# Patient Record
Sex: Male | Born: 1985 | Race: Black or African American | Hispanic: No | Smoking: Never smoker
Health system: Southern US, Community
[De-identification: ages and names within clinical notes are randomized; demographics above are authoritative.]

---

## 2015-02-16 ENCOUNTER — Emergency Department
Admission: EM | Admit: 2015-02-16 | Discharge: 2015-02-16 | Disposition: A | Discharge status: Home / Self Care | Payer: Self-pay | Attending: Student | Admitting: Student

## 2015-02-16 ENCOUNTER — Emergency Department: Payer: Self-pay

## 2015-02-16 DIAGNOSIS — Y9289 Other specified places as the place of occurrence of the external cause: Secondary | ICD-10-CM | POA: Insufficient documentation

## 2015-02-16 DIAGNOSIS — S63501A Unspecified sprain of right wrist, initial encounter: Secondary | ICD-10-CM | POA: Insufficient documentation

## 2015-02-16 DIAGNOSIS — Y998 Other external cause status: Secondary | ICD-10-CM | POA: Insufficient documentation

## 2015-02-16 DIAGNOSIS — W1839XA Other fall on same level, initial encounter: Secondary | ICD-10-CM | POA: Insufficient documentation

## 2015-02-16 DIAGNOSIS — S63061A Subluxation of metacarpal (bone), proximal end of right hand, initial encounter: Secondary | ICD-10-CM | POA: Insufficient documentation

## 2015-02-16 DIAGNOSIS — Y9389 Activity, other specified: Secondary | ICD-10-CM | POA: Insufficient documentation

## 2015-02-16 MED ORDER — TRAMADOL HCL 50 MG PO TABS: 50.0000 mg | ORAL_TABLET | Freq: Four times a day (QID) | ORAL | Status: AC | PRN

## 2015-02-16 NOTE — ED Notes (Signed)
Reports fishing 2 wks ago and slipped and fell and hurt right hand.  Still having pain. +PMS

## 2015-02-16 NOTE — Discharge Instructions (Signed)
Cryotherapy Cryotherapy is when you put ice on your injury. Ice helps lessen pain and puffiness (swelling) after an injury. Ice works the best when you start using it in the first 24 to 48 hours after an injury. HOME CARE  Put a dry or damp towel between the ice pack and your skin.  You may press gently on the ice pack.  Leave the ice on for no more than 10 to 20 minutes at a time.  Check your skin after 5 minutes to make sure your skin is okay.  Rest at least 20 minutes between ice pack uses.  Stop using ice when your skin loses feeling (numbness).  Do not use ice on someone who cannot tell you when it hurts. This includes small children and people with memory problems (dementia). GET HELP RIGHT AWAY IF:  You have white spots on your skin.  Your skin turns blue or pale.  Your skin feels waxy or hard.  Your puffiness gets worse. MAKE SURE YOU:   Understand these instructions.  Will watch your condition.  Will get help right away if you are not doing well or get worse. Document Released: 01/21/2008 Document Revised: 10/27/2011 Document Reviewed: 03/27/2011 [facility] Patient Information 2015 [place]. This information is not intended to replace advice given to you by your health care provider. Make sure you discuss any questions you have with your health care provider.  Dislocation or Subluxation Dislocation of a joint occurs when ends of two or more adjacent bones no longer touch each other. A subluxation is a minor form of a dislocation, in which two or more adjacent bones are no longer properly aligned. The most common joints susceptible to a dislocation are the shoulder, kneecap, and fingers.  SYMPTOMS   Sudden pain at the time of injury.  Noticeable deformity in the area of the joint.  Limited range of motion. CAUSES   Usually a traumatic injury that stretches or tears ligaments that surround a joint and hold the bones together.  Condition present at birth  (congenital) in which the joint surfaces are shallow or abnormally formed.  Joint disease such as arthritis or other diseases of ligaments and tissues around a joint. RISK INCREASES WITH:  Repeated injury to a joint.  Previous dislocation of a joint.  Contact sports (football, rugby, hockey, lacrosse) or sports that require repetitive overhead arm motion (throwing, swimming, volleyball).  Rheumatoid arthritis.  Congenital joint condition. PREVENTION  Warm up and stretch properly before activity.  Maintain physical fitness:  Joint flexibility.  Muscle strength and endurance.  Cardiovascular fitness.  Wear proper protective equipment and ensure correct fit.  Learn and use proper technique. PROGNOSIS  This condition is usually curable with prompt treatment. After the dislocation has been put back in place, the joint may require immobilization with a cast, splint, or sling for 2 to 6 weeks, often followed by strength and stretching exercises that may be performed at home or with a therapist. RELATED COMPLICATIONS   Damage to nearby nerves or major blood vessels, causing numbness, coldness, or paleness.  Recurrent injury to the joint.  Arthritis of affected joint.  Fracture of joint. TREATMENT Treatment initially involves realigning the bones (reduction) of the joint. Reductions should only be performed by someone who is trained in the procedure. After the joint is reduced, medicine and ice should be used to reduce pain and inflammation. The joint may be immobilized to allow for the muscles and ligaments to heal. If a joint is subjected  to recurrent dislocations, surgery may be necessary to tighten or replace injured structures. After surgery, stretching and strengthening exercises may be required. These may be performed at home or with a therapist. MEDICATION   Patients may require medicine to help them relax (sedative) or muscle relaxants in order to reduce the joint.  If  pain medicine is necessary, nonsteroidal anti-inflammatory medicines, such as aspirin and ibuprofen, or other minor pain relievers, such as acetaminophen, are often recommended.  Do not take pain medicine for 7 days before surgery.  Prescription pain relievers may be necessary. Use only as directed and only as much as you need. SEEK MEDICAL CARE IF:   Symptoms get worse or do not improve despite treatment.  You have difficulty moving a joint after injury.  Any extremity becomes numb, pale, or cool after injury. This is an emergency.  Dislocations or subluxations occur repeatedly. Document Released: 08/04/2005 Document Revised: 10/27/2011 Document Reviewed: 11/16/2008 [facility] Patient Information 2015 [place], [place]. This information is not intended to replace advice given to you by your health care provider. Make sure you discuss any questions you have with your health care provider.

## 2015-02-16 NOTE — ED Provider Notes (Signed)
CSN: [id number]     Arrival date & time 02/16/15  1649 History   First MD Initiated Contact with Patient 02/16/15 1726     Chief Complaint  Patient presents with  . Hand Pain     (Consider location/radiation/quality/duration/timing/severity/associated sxs/prior Treatment)  HPI 29 year old male presents today for evaluation of right hand pain. One day ago patient fell and landed on his right hand and wrist. Patient developed pain along the dorsal aspect of the right hand at the base of the third metacarpal. He's had mild swelling. Pain is been moderate. She's been taking ibuprofen and Tylenol with minimal relief. No other injuries to his body. He is able to make a fist. He is right-hand dominant    History reviewed. No pertinent past medical history. History reviewed. No pertinent past surgical history. No family history on file. History  Substance Use Topics  . Smoking status: Never Smoker   . Smokeless tobacco: Never Used  . Alcohol Use: No    Review of Systems  Constitutional: Negative.  Negative for fever, chills, activity change and appetite change.  HENT: Negative for congestion, ear pain, mouth sores, rhinorrhea, sinus pressure, sore throat and trouble swallowing.   Eyes: Negative for photophobia, pain and discharge.  Respiratory: Negative for cough, chest tightness and shortness of breath.   Cardiovascular: Negative for chest pain and leg swelling.  Gastrointestinal: Negative for nausea, vomiting, abdominal pain, diarrhea and abdominal distention.  Genitourinary: Negative for dysuria and difficulty urinating.  Musculoskeletal: Positive for joint swelling and arthralgias. Negative for back pain and gait problem.  Skin: Negative for color change and rash.  Neurological: Negative for dizziness and headaches.  Hematological: Negative for adenopathy.  Psychiatric/Behavioral: Negative for behavioral problems and agitation.      Allergies  Review of patient's allergies  indicates no known allergies.  Home Medications   Prior to Admission medications   Medication Sig Start Date End Date Taking? Authorizing Provider  traMADol (ULTRAM) 50 MG tablet Take 1 tablet (50 mg total) by mouth every 6 (six) hours as needed. 02/16/15   Evon Slackhomas C Marliss Buttacavoli, PA-C   BP 133/80 mmHg  Pulse 54  Temp(Src) 98.3 F (36.8 C) (Oral)  Resp 18  Ht 6' (1.829 m)  Wt 180 lb (81.647 kg)  BMI 24.41 kg/m2  SpO2 100% Physical Exam  Constitutional: He is oriented to person, place, and time. He appears well-developed and well-nourished.  HENT:  Head: Normocephalic and atraumatic.  Eyes: Conjunctivae and EOM are normal.  Neck: Normal range of motion. Neck supple.  Pulmonary/Chest: Effort normal. No respiratory distress.  Musculoskeletal:       Right hand: He exhibits bony tenderness (third MCP joint). He exhibits normal range of motion, normal capillary refill and no deformity. Normal sensation noted. Decreased sensation is not present in the ulnar distribution, is not present in the medial distribution and is not present in the radial distribution. Normal strength noted.  No scaphoid tenderness. Full composite fist. Grip strength 5 out of 5. No distal radius or ulnar styloid tenderness. No DRUJ instability  Neurological: He is alert and oriented to person, place, and time.  Skin: Skin is warm and dry.  Psychiatric: He has a normal mood and affect. His behavior is normal. Judgment and thought content normal.    ED Course  Procedures (including critical care time) Labs Review Labs Reviewed - No data to display  Imaging Review Dg Hand Complete Right  02/16/2015   CLINICAL DATA:  Status post fall while fishing  2 weeks ago, with injury to right hand. Right second and third metacarpal pain. Initial encounter.  EXAM: RIGHT HAND - COMPLETE 3+ VIEW  COMPARISON:  None.  FINDINGS: There is misalignment of the dorsal aspect of the base of either the second or third metacarpal on the lateral view.  This is thought more likely to be the base of the third metacarpal. Associated cortical irregularity is seen. This may reflect small fractures of the capitate and metacarpal with associated dorsal displacement, or could reflect a chronic injury given a relatively well-corticated appearance. Would correlate for associated focal symptoms.  The carpal rows are otherwise intact. Soft tissue swelling is noted along the dorsum of the wrist.  IMPRESSION: Misalignment of the dorsal aspect of the base of either the second or third metacarpal, thought more likely to the base of the third metacarpal. This may reflect small fractures of the capitate and third metacarpal, with associated dorsal displacement, or could reflect a chronic injury given a relatively well-corticated appearance. Would correlate for associated focal symptoms.   Electronically Signed   By: Roanna Raider M.D.   On: 02/16/2015 17:32     EKG Interpretation None      MDM   Final diagnoses:  Wrist sprain, right, initial encounter  Subluxation of proximal end of metacarpal bone, right, initial encounter    29 year old male with 2 day history of right dorsal hand pain along the base of the third metacarpal. X-rays show minimal dorsal displacement of the 3rd metacarpal at the MCP joint. Patient with no angulation or rotation of the digits. He is able to make a fist. Recommend that he be placed into a Velcro wrist splint and follow-up with orthopedics in 2 days. The meantime rest ice and elevate the upper extremity. Continue Tylenol and ibuprofen for pain. Patient was given a prescription for tramadol 50 mg 1 tab by mouth every 4-6 hours as needed for pain quantity #20.    Evon Slack, PA-C 02/16/15 1801  Gayla Doss, MD 02/17/15 [phone]

## 2016-05-24 IMAGING — CR DG HAND COMPLETE 3+V*R*
1 series · 3 of 3 positions shown · non-contrast
Comparison: None.

CLINICAL DATA: Status post fall while fishing 2 weeks ago, with
injury to right hand. Right second and third metacarpal pain.
Initial encounter.

EXAM:
RIGHT HAND - COMPLETE 3+ VIEW

[Series 1: pa · 0.17mm/px · 3 of 3 slices shown]
[im 1/3]
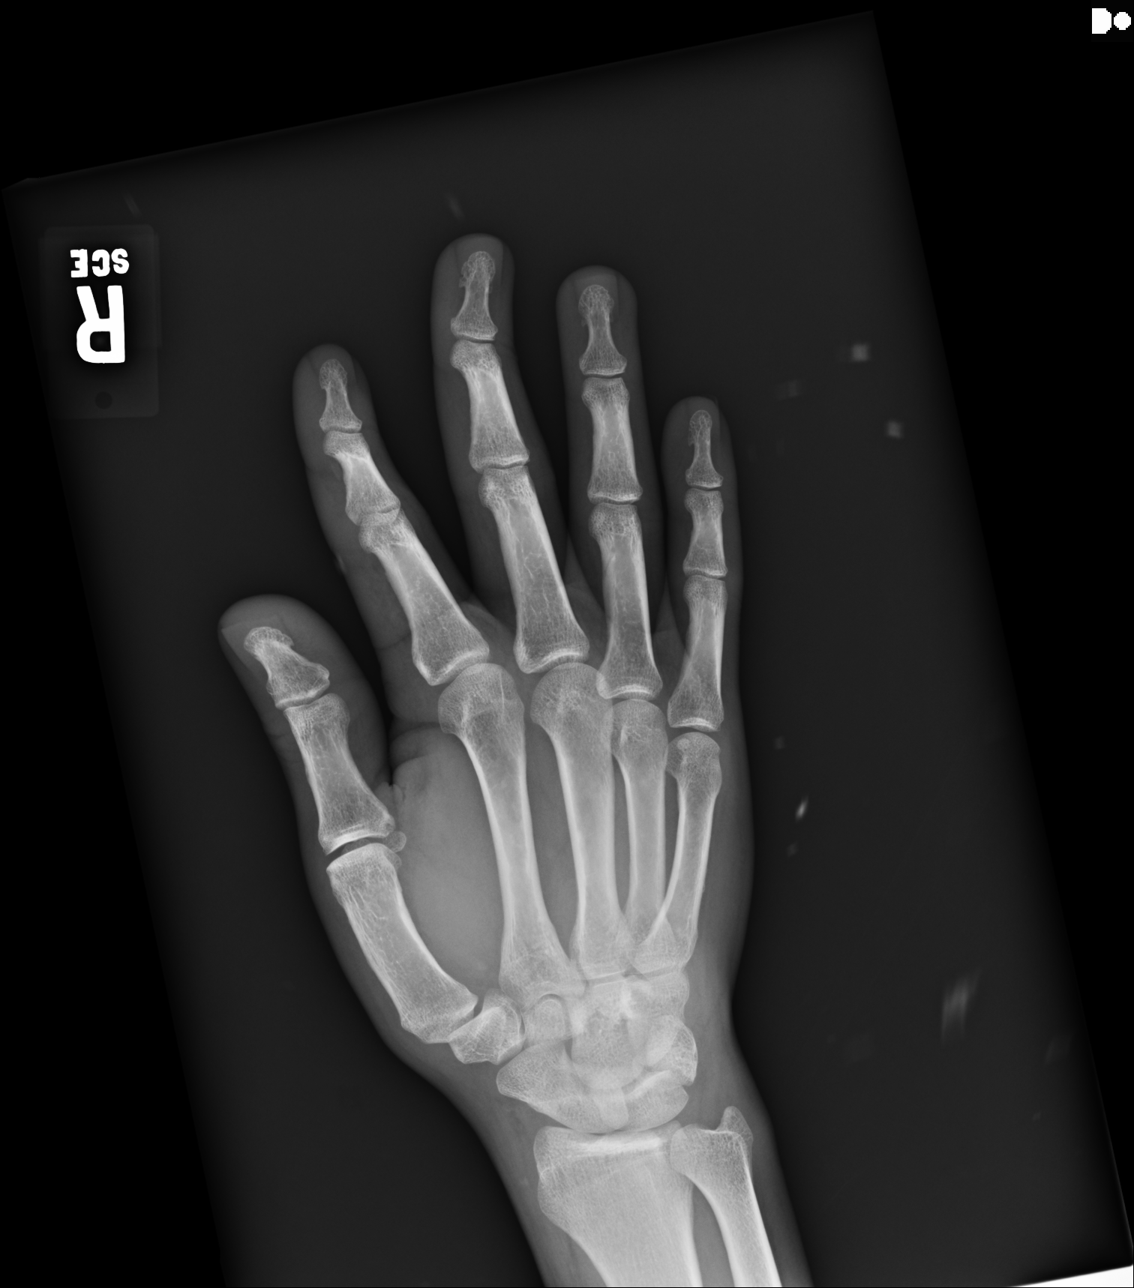
[im 2/3]
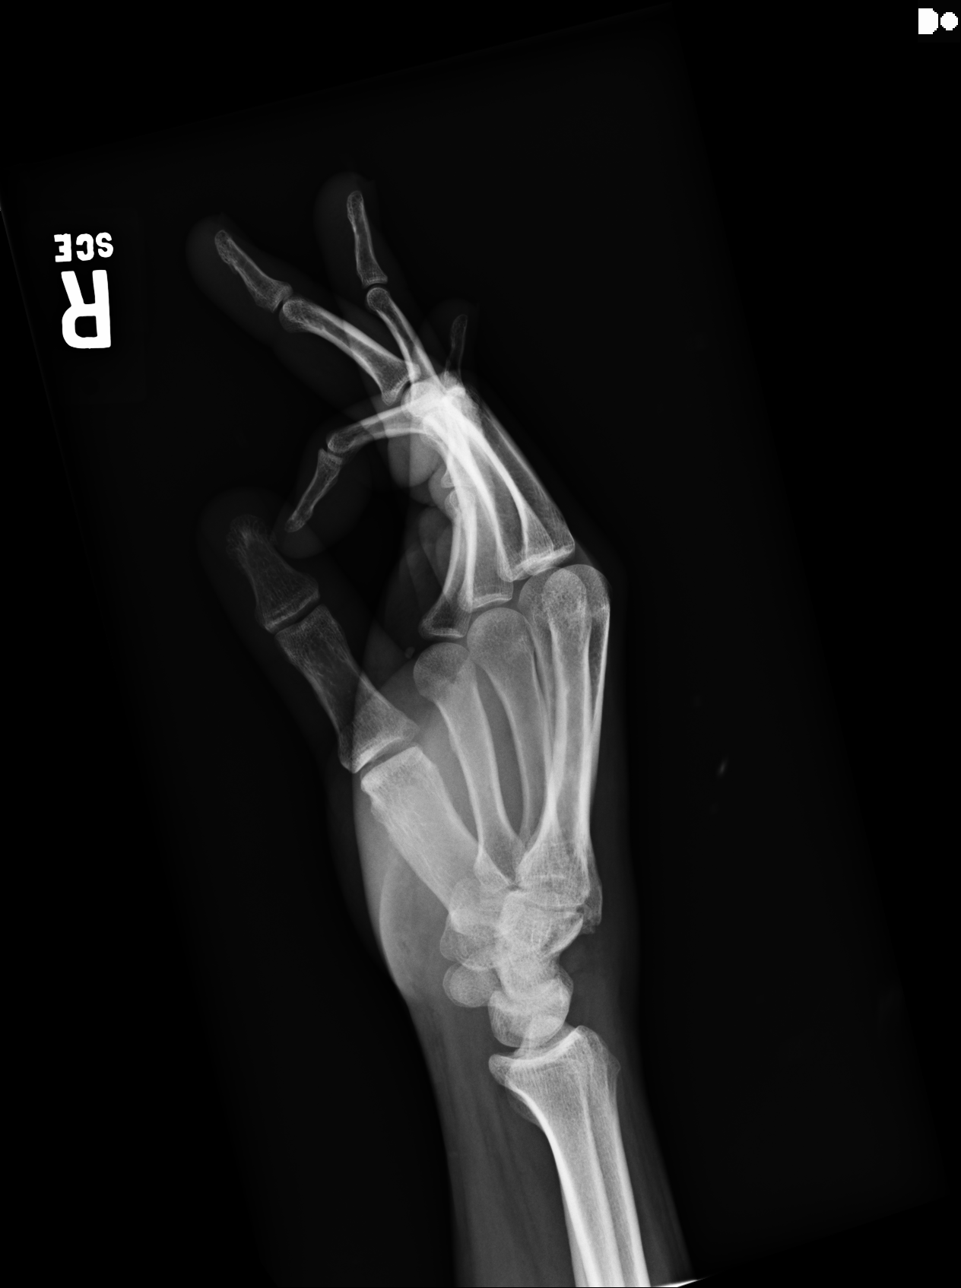
[im 3/3]
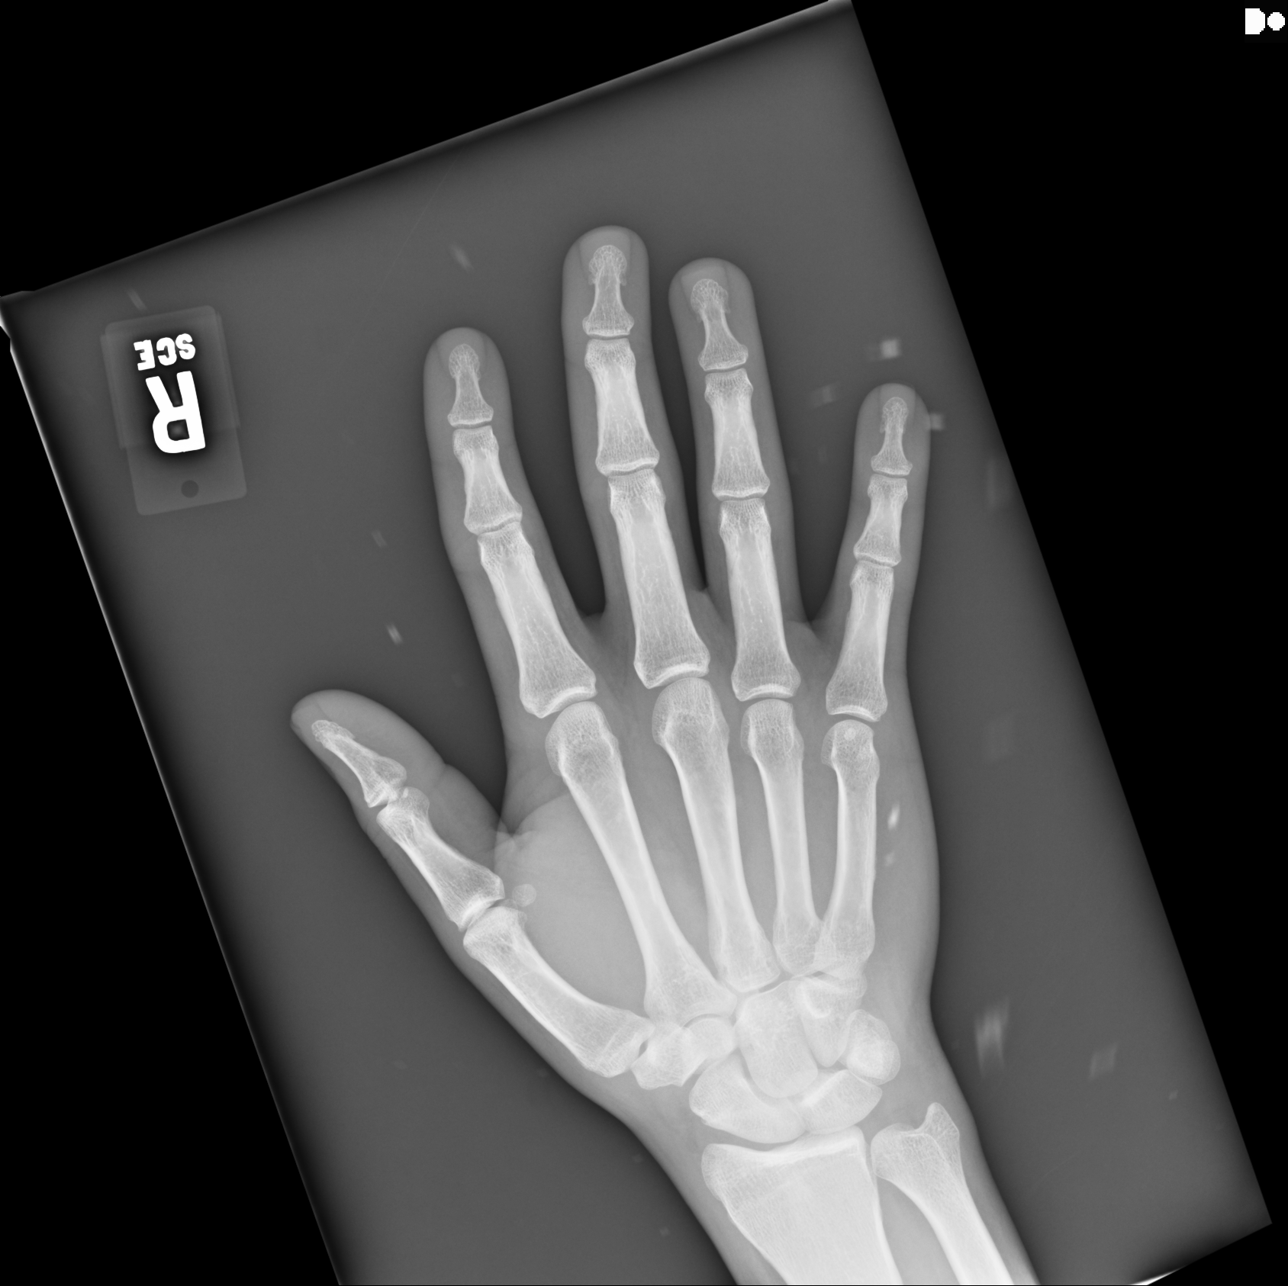

[3 of 3 positions shown; findings below may reference images not displayed]

FINDINGS: There is misalignment of the dorsal aspect of the base of either the
second or third metacarpal on the lateral view. This is thought more
likely to be the base of the third metacarpal. Associated cortical
irregularity is seen. This may reflect small fractures of the
capitate and metacarpal with associated dorsal displacement, or
could reflect a chronic injury given a relatively well-corticated
appearance. Would correlate for associated focal symptoms.

The carpal rows are otherwise intact. Soft tissue swelling is noted
along the dorsum of the wrist.
IMPRESSION: Misalignment of the dorsal aspect of the base of either the second
or third metacarpal, thought more likely to the base of the third
metacarpal. This may reflect small fractures of the capitate and
third metacarpal, with associated dorsal displacement, or could
reflect a chronic injury given a relatively well-corticated
appearance. Would correlate for associated focal symptoms.

## 2017-11-26 ENCOUNTER — Emergency Department
Admission: EM | Admit: 2017-11-26 | Discharge: 2017-11-26 | Disposition: A | Discharge status: Home / Self Care | Payer: Self-pay | Attending: Emergency Medicine | Admitting: Emergency Medicine

## 2017-11-26 ENCOUNTER — Other Ambulatory Visit: Payer: Self-pay

## 2017-11-26 ENCOUNTER — Encounter: Payer: Self-pay | Admitting: Emergency Medicine

## 2017-11-26 DIAGNOSIS — H6122 Impacted cerumen, left ear: Secondary | ICD-10-CM | POA: Insufficient documentation

## 2017-11-26 NOTE — ED Provider Notes (Signed)
[facility] Emergency Department Provider Note  ____________________________________________   First MD Initiated Contact with Patient 11/26/17 1832     (approximate)  I have reviewed the triage vital signs and the nursing notes.   HISTORY  Chief Complaint wax in ear    HPI Joel Parks is a 32 y.o. male presents emergency department complaining of wax buildup in the left ear.  States he cannot hear out of the ear well.  He states he did pull a little bit out earlier today but the ear still feels full.  He denies any other issues.  Denies any trauma to the ear.  History reviewed. No pertinent past medical history.  There are no active problems to display for this patient.   History reviewed. No pertinent surgical history.  Prior to Admission medications   Medication Sig Start Date End Date Taking? Authorizing Provider  traMADol (ULTRAM) 50 MG tablet Take 1 tablet (50 mg total) by mouth every 6 (six) hours as needed. 02/16/15   Evon SlackGaines, Thomas C, PA-C    Allergies Patient has no known allergies.  History reviewed. No pertinent family history.  Social History Social History   Tobacco Use  . Smoking status: Never Smoker  . Smokeless tobacco: Never Used  Substance Use Topics  . Alcohol use: No  . Drug use: Yes    Types: Marijuana    Review of Systems  Constitutional: No fever/chills Eyes: No visual changes. ENT: No sore throat.  Positive for left ear decreased hearing and wax buildup Respiratory: Denies cough Genitourinary: Negative for dysuria. Musculoskeletal: Negative for back pain. Skin: Negative for rash.    ____________________________________________   PHYSICAL EXAM:  VITAL SIGNS: ED Triage Vitals  Enc Vitals Group     BP 11/26/17 1821 127/72     Pulse Rate 11/26/17 1821 93     Resp 11/26/17 1821 14     Temp 11/26/17 1821 98.7 F (37.1 C)     Temp Source 11/26/17 1821 Oral     SpO2 11/26/17 1821 96 %     Weight  11/26/17 1820 180 lb (81.6 kg)     Height 11/26/17 1820 6' (1.829 m)     Head Circumference --      Peak Flow --      Pain Score 11/26/17 1820 0     Pain Loc --      Pain Edu? --      Excl. in GC? --     Constitutional: Alert and oriented. Well appearing and in no acute distress. Eyes: Conjunctivae are normal.  Head: Atraumatic. Ears: The right TM is clear.  The left ear canal is positive for cerumen impaction. Nose: No congestion/rhinnorhea. Mouth/Throat: Mucous membranes are moist.   Neck: Is supple, no lymphadenopathy Cardiovascular: Normal rate, regular rhythm.  Heart sounds are normal Respiratory: Normal respiratory effort.  No retractions, lungs clear to auscultation GU: deferred Musculoskeletal: FROM all extremities, warm and well perfused Neurologic:  Normal speech and language.  Skin:  Skin is warm, dry and intact. No rash noted. Psychiatric: Mood and affect are normal. Speech and behavior are normal.  ____________________________________________   LABS (all labs ordered are listed, but only abnormal results are displayed)  Labs Reviewed - No data to display ____________________________________________   ____________________________________________  RADIOLOGY    ____________________________________________   PROCEDURES  Procedure(s) performed:   .Ear Cerumen Removal Date/Time: 11/26/2017 6:50 PM Performed by: Faythe GheeFisher, Susan W, PA-C Authorized by: Faythe GheeFisher, Susan W, PA-C   Consent:  Consent obtained:  Verbal   Consent given by:  Patient   Risks discussed:  Bleeding, dizziness, incomplete removal, infection, pain and TM perforation   Alternatives discussed:  No treatment Procedure details:    Location:  L ear   Procedure type: irrigation   Post-procedure details:    Inspection:  TM intact   Hearing quality:  Normal   Patient tolerance of procedure:  Tolerated well, no immediate  complications      ____________________________________________   INITIAL IMPRESSION / ASSESSMENT AND PLAN / ED COURSE  Pertinent labs & imaging results that were available during my care of the patient were reviewed by me and considered in my medical decision making (see chart for details).  Patient is 32 year old male presents emergency department complaining of wax buildup in the left ear with decreased hearing.  On physical exam the left ear canal is positive for cerumen impaction.  The left ear was irrigated with warm water.  All of the wax was removed.  The left TM was intact post irrigation.  The patient is able to hear as normal.  He tolerated procedure well  Diagnosis is cerumen impaction.  The patient was instructed to use Debrox once a week to prevent the impaction.  Patient was discharged in stable condition     As part of my medical decision making, I reviewed the following data within the electronic MEDICAL RECORD NUMBER Nursing notes reviewed and incorporated, Notes from prior ED visits and [place] Controlled Substance Database  ____________________________________________   FINAL CLINICAL IMPRESSION(S) / ED DIAGNOSES  Final diagnoses:  Impacted cerumen of left ear      NEW MEDICATIONS STARTED DURING THIS VISIT:  New Prescriptions   No medications on file     Note:  This document was prepared using Dragon voice recognition software and may include unintentional dictation errors.    Faythe Ghee, PA-C 11/26/17 1852    Pershing Proud Myra Rude, MD 11/26/17 [phone]

## 2017-11-26 NOTE — ED Triage Notes (Signed)
Here for wax in left ear and difficulty hearing.  No pain.

## 2017-11-26 NOTE — Discharge Instructions (Addendum)
Follow-up with your regular doctor or the acute care if not better in 3-5 days.  He is over-the-counter Debrox once a week to prevent wax buildup.  Return to the ER if you are worsening
